# Patient Record
Sex: Male | Born: 1975 | Race: White | Hispanic: No | Smoking: Current every day smoker
Health system: Southern US, Community
[De-identification: ages and names within clinical notes are randomized; demographics above are authoritative.]

## PROBLEM LIST (undated history)

## (undated) DIAGNOSIS — N411 Chronic prostatitis: Secondary | ICD-10-CM

## (undated) DIAGNOSIS — K219 Gastro-esophageal reflux disease without esophagitis: Secondary | ICD-10-CM

## (undated) DIAGNOSIS — R911 Solitary pulmonary nodule: Secondary | ICD-10-CM

## (undated) DIAGNOSIS — T7840XA Allergy, unspecified, initial encounter: Secondary | ICD-10-CM

## (undated) DIAGNOSIS — IMO0001 Reserved for inherently not codable concepts without codable children: Secondary | ICD-10-CM

## (undated) HISTORY — DX: Chronic prostatitis: N41.1

## (undated) HISTORY — DX: Gastro-esophageal reflux disease without esophagitis: K21.9

## (undated) HISTORY — DX: Reserved for inherently not codable concepts without codable children: IMO0001

## (undated) HISTORY — DX: Solitary pulmonary nodule: R91.1

## (undated) HISTORY — DX: Allergy, unspecified, initial encounter: T78.40XA

---

## 1998-08-26 ENCOUNTER — Emergency Department (HOSPITAL_COMMUNITY): Admission: EM | Admit: 1998-08-26 | Discharge: 1998-08-26 | Payer: Self-pay | Admitting: Emergency Medicine

## 1998-08-26 ENCOUNTER — Encounter: Payer: Self-pay | Admitting: Emergency Medicine

## 1998-08-31 ENCOUNTER — Encounter: Admission: RE | Admit: 1998-08-31 | Discharge: 1998-08-31 | Payer: Self-pay | Admitting: *Deleted

## 2010-04-16 ENCOUNTER — Ambulatory Visit: Payer: Self-pay | Admitting: Sports Medicine

## 2010-11-19 ENCOUNTER — Ambulatory Visit: Payer: Self-pay

## 2011-06-06 ENCOUNTER — Ambulatory Visit: Payer: Self-pay | Admitting: Unknown Physician Specialty

## 2013-06-20 ENCOUNTER — Emergency Department: Payer: Self-pay | Admitting: Emergency Medicine

## 2013-06-21 LAB — CBC
HCT: 43.2 % (ref 40.0–52.0)
HGB: 14.9 g/dL (ref 13.0–18.0)
MCH: 30.3 pg (ref 26.0–34.0)
MCHC: 34.5 g/dL (ref 32.0–36.0)
MCV: 88 fL (ref 80–100)
Platelet: 156 10*3/uL (ref 150–440)
RBC: 4.91 10*6/uL (ref 4.40–5.90)
RDW: 13.5 % (ref 11.5–14.5)
WBC: 6.8 10*3/uL (ref 3.8–10.6)

## 2013-06-21 LAB — BASIC METABOLIC PANEL
Anion Gap: 6 — ABNORMAL LOW (ref 7–16)
BUN: 18 mg/dL (ref 7–18)
Calcium, Total: 8.6 mg/dL (ref 8.5–10.1)
Chloride: 109 mmol/L — ABNORMAL HIGH (ref 98–107)
Co2: 25 mmol/L (ref 21–32)
Creatinine: 0.82 mg/dL (ref 0.60–1.30)
EGFR (African American): >60
EGFR (Non-African Amer.): >60
Glucose: 88 mg/dL (ref 65–99)
Osmolality: 281 (ref 275–301)
Potassium: 3.8 mmol/L (ref 3.5–5.1)
Sodium: 140 mmol/L (ref 136–145)

## 2013-06-21 LAB — TROPONIN I: Troponin-I: <0.02 ng/mL

## 2014-01-30 ENCOUNTER — Ambulatory Visit: Payer: Self-pay | Admitting: Gastroenterology

## 2015-06-19 ENCOUNTER — Other Ambulatory Visit: Payer: Self-pay

## 2015-06-19 NOTE — Telephone Encounter (Signed)
Received Rx Refill request from [place]. Pt has not been seen since 05/18/2014 so this medication cannot be refilled until patient is seen in the office.  Called patient at this time to give him this information.  He states that he has actually quit this medication 3 months ago and is feeling fine without this so he does not feel that he needs to make an appointment at this time. He will call back if he develops new symptoms or if he feels that he needs to be seen.

## 2016-01-25 ENCOUNTER — Ambulatory Visit
Admission: RE | Admit: 2016-01-25 | Discharge: 2016-01-25 | Disposition: A | Discharge status: Home / Self Care | Payer: BLUE CROSS/BLUE SHIELD | Source: Ambulatory Visit | Attending: Family Medicine | Admitting: Family Medicine

## 2016-01-25 ENCOUNTER — Other Ambulatory Visit: Payer: Self-pay | Admitting: Family Medicine

## 2016-01-25 DIAGNOSIS — M79662 Pain in left lower leg: Secondary | ICD-10-CM | POA: Insufficient documentation

## 2016-12-29 ENCOUNTER — Ambulatory Visit (INDEPENDENT_AMBULATORY_CARE_PROVIDER_SITE_OTHER): Payer: BLUE CROSS/BLUE SHIELD | Admitting: Physician Assistant

## 2016-12-29 ENCOUNTER — Encounter: Payer: Self-pay | Admitting: Physician Assistant

## 2016-12-29 VITALS — BP 133/86 | HR 68 | Temp 98.2°F | Resp 16 | Ht 67.0 in | Wt 202.0 lb

## 2016-12-29 DIAGNOSIS — R911 Solitary pulmonary nodule: Secondary | ICD-10-CM | POA: Diagnosis not present

## 2016-12-29 DIAGNOSIS — IMO0001 Reserved for inherently not codable concepts without codable children: Secondary | ICD-10-CM

## 2016-12-29 DIAGNOSIS — Z87891 Personal history of nicotine dependence: Secondary | ICD-10-CM

## 2016-12-29 MED ORDER — VARENICLINE TARTRATE 1 MG PO TABS: 1.0000 mg | ORAL_TABLET | Freq: Two times a day (BID) | ORAL | 1 refills | Status: DC

## 2016-12-29 MED ORDER — VARENICLINE TARTRATE 0.5 MG X 11 & 1 MG X 42 PO MISC: ORAL | 0 refills | Status: DC

## 2016-12-29 NOTE — Patient Instructions (Signed)
     IF you received an x-ray today, you will receive an invoice from [facility]. Please contact [facility] at [phone] with questions or concerns regarding your invoice.   IF you received labwork today, you will receive an invoice from [facility]. Please contact [facility] at [phone] with questions or concerns regarding your invoice.   Our billing staff will not be able to assist you with questions regarding bills from these companies.  You will be contacted with the lab results as soon as they are available. The fastest way to get your results is to activate your My Chart account. Instructions are located on the last page of this paperwork. If you have not heard from us regarding the results in 2 weeks, please contact this office.     

## 2016-12-29 NOTE — Progress Notes (Signed)
  12/30/2016 3:00 PM   DOB: July 16, 1976 / MRN: [id number]  SUBJECTIVE:  Jordan Kelley is a 41 y.o. male presenting for follow up of lung nodule. Two years ago a 4 mm subpelural right middle lobe nodule was seen on an abdominal CT.   He would like a prescription for chantix. 25 pack year history of smoking.  Has tried several methods for quitting and was unsuccessful.  He has no allergies on file.   He  has a past medical history of Allergy; GERD (gastroesophageal reflux disease); Lung nodule < 6cm on CT; and Prostatitis, chronic.    He  reports that he has been smoking.  He has never used smokeless tobacco. He reports that he drinks about 3.0 oz of alcohol per week . He reports that he does not use drugs. He  reports that he currently engages in sexual activity and has had male partners. The patient  has no past surgical history on file.  His family history includes Diabetes in his father.  Review of Systems  Constitutional: Negative for malaise/fatigue and weight loss.  Respiratory: Negative for cough, hemoptysis and shortness of breath.   Cardiovascular: Negative for chest pain and leg swelling.    The problem list and medications were reviewed and updated by myself where necessary and exist elsewhere in the encounter.   OBJECTIVE:  BP 133/86   Pulse 68   Temp 98.2 F (36.8 C) (Oral)   Resp 16   Ht 5\' 7"  (1.702 m)   Wt 202 lb (91.6 kg)   SpO2 98%   BMI 31.64 kg/m   Physical Exam  Constitutional: He appears well-developed and well-nourished. No distress.  HENT:  Right Ear: Hearing normal.  Left Ear: Hearing normal.  Mouth/Throat: Uvula is midline, oropharynx is clear and moist and mucous membranes are normal.  Cardiovascular: Normal rate and regular rhythm.   Pulmonary/Chest: Effort normal and breath sounds normal.  Musculoskeletal: Normal range of motion.  Neurological: He is alert. He displays normal reflexes. No cranial nerve deficit. He exhibits normal muscle  tone. Coordination normal.  Skin: Skin is warm and dry. He is not diaphoretic.  Vitals reviewed.    ASSESSMENT AND PLAN:  Jordany was seen today for follow-up.  Diagnoses and all orders for this visit:  Lung nodule < 6cm on CT: RTC in three months for full physical.  Will follow below.  -     CT CHEST NODULE FOLLOW UP LOW DOSE W/O; Future  History of smoking 10-25 pack years -     varenicline (CHANTIX STARTING MONTH PAK) 0.5 MG X 11 & 1 MG X 42 tablet; Take one 0.5 mg tablet by mouth once daily for 3 days, then increase to one 0.5 mg tablet twice daily for 4 days, then increase to one 1 mg tablet twice daily. -     varenicline (CHANTIX CONTINUING MONTH PAK) 1 MG tablet; Take 1 tablet (1 mg total) by mouth 2 (two) times daily.    The patient is advised to call or return to clinic if he does not see an improvement in symptoms, or to seek the care of the closest emergency department if he worsens with the above plan.   Philis Fendt, MHS, PA-C [facility] 12/30/2016 3:00 PM

## 2017-01-14 ENCOUNTER — Ambulatory Visit
Admission: RE | Admit: 2017-01-14 | Discharge: 2017-01-14 | Disposition: A | Discharge status: Home / Self Care | Payer: BLUE CROSS/BLUE SHIELD | Source: Ambulatory Visit | Attending: Physician Assistant | Admitting: Physician Assistant

## 2017-01-14 DIAGNOSIS — R911 Solitary pulmonary nodule: Secondary | ICD-10-CM

## 2017-01-14 DIAGNOSIS — IMO0001 Reserved for inherently not codable concepts without codable children: Secondary | ICD-10-CM

## 2017-01-15 ENCOUNTER — Telehealth: Payer: Self-pay | Admitting: Physician Assistant

## 2017-01-15 MED ORDER — BUPROPION HCL ER (SR) 100 MG PO TB12: ORAL_TABLET | ORAL | 5 refills | Status: AC

## 2017-01-15 NOTE — Telephone Encounter (Signed)
Patient advised of CT resutls and recs. Will recan in 6 months from today.  Scheduling please make him an appointment in my clinic in 5 to 6 months. Philis Fendt, MS, PA-C 2:07 PM, 01/15/2017

## 2017-03-03 ENCOUNTER — Telehealth: Payer: Self-pay | Admitting: Physician Assistant

## 2017-03-03 NOTE — Telephone Encounter (Signed)
Jordan Kelley - Pt was put on wellbutrin to help him quit smoking.  He has quit, but now the medication is causing him some problems.  He wants to come off of it, but knows that it is not something he can just stop.  Please call with instructions on how to do this.  [phone]

## 2017-03-03 NOTE — Telephone Encounter (Signed)
Please advise how to wean

## 2017-03-04 ENCOUNTER — Other Ambulatory Visit: Payer: Self-pay | Admitting: Physician Assistant

## 2017-03-04 ENCOUNTER — Telehealth: Payer: Self-pay | Admitting: General Practice

## 2017-03-04 DIAGNOSIS — R918 Other nonspecific abnormal finding of lung field: Secondary | ICD-10-CM

## 2017-03-04 NOTE — Telephone Encounter (Signed)
Pt is checking on status of his message about to come off welbutrin  Best [phone]

## 2017-04-06 ENCOUNTER — Ambulatory Visit: Payer: BLUE CROSS/BLUE SHIELD | Admitting: Physician Assistant

## 2017-09-08 ENCOUNTER — Telehealth: Payer: Self-pay | Admitting: Physician Assistant

## 2017-09-08 DIAGNOSIS — R918 Other nonspecific abnormal finding of lung field: Secondary | ICD-10-CM

## 2017-09-08 NOTE — Telephone Encounter (Signed)
Pt's friend Jordan Kelley called about CT pt is needing done. She wanted to request that it be done at [facility]. I see that this was ordered in April and to be done in August but this never popped up to be scheduled and has expired. Please place new order and I will prior auth and get this scheduled. Thanks!

## 2017-09-10 NOTE — Telephone Encounter (Signed)
Order is in. Please help them navigate.  Thank you Deneise Lever. Philis Fendt, MS, PA-C 12:02 PM, 09/10/2017

## 2017-09-21 ENCOUNTER — Ambulatory Visit: Payer: BLUE CROSS/BLUE SHIELD | Admitting: Physician Assistant

## 2017-09-23 ENCOUNTER — Ambulatory Visit
Admission: RE | Admit: 2017-09-23 | Discharge: 2017-09-23 | Disposition: A | Discharge status: Home / Self Care | Payer: BLUE CROSS/BLUE SHIELD | Source: Ambulatory Visit | Attending: Physician Assistant | Admitting: Physician Assistant

## 2017-09-23 ENCOUNTER — Telehealth: Payer: Self-pay

## 2017-09-23 DIAGNOSIS — R918 Other nonspecific abnormal finding of lung field: Secondary | ICD-10-CM | POA: Diagnosis present

## 2017-09-23 NOTE — Progress Notes (Signed)
Call patient and advise that there are no new nodules and the remaining nodules are not growing.  Repeat scan in one year.  Please send a letter. Please order CT chest Wo contrast for one year from today.  I will cosign. Philis Fendt, MS, PA-C 3:46 PM, 09/23/2017

## 2017-09-23 NOTE — Telephone Encounter (Signed)
Call patient and advise that there are no new nodules and the remaining nodules are not growing. Repeat scan in one year. Please send a letter. Please order CT chest Wo contrast for one year from today. I will cosign. Philis Fendt, MS, PA-C 3:46 PM, 09/23/2017

## 2017-09-29 ENCOUNTER — Encounter: Payer: Self-pay | Admitting: Physician Assistant

## 2017-09-29 ENCOUNTER — Ambulatory Visit (INDEPENDENT_AMBULATORY_CARE_PROVIDER_SITE_OTHER): Payer: BLUE CROSS/BLUE SHIELD | Admitting: Physician Assistant

## 2017-09-29 VITALS — BP 140/92 | HR 60 | Temp 98.2°F | Resp 16 | Ht 67.0 in | Wt 197.0 lb

## 2017-09-29 DIAGNOSIS — F17200 Nicotine dependence, unspecified, uncomplicated: Secondary | ICD-10-CM

## 2017-09-29 DIAGNOSIS — D229 Melanocytic nevi, unspecified: Secondary | ICD-10-CM | POA: Diagnosis not present

## 2017-09-29 MED ORDER — NICOTINE 10 MG IN INHA: 1.0000 | RESPIRATORY_TRACT | 0 refills | Status: AC | PRN

## 2017-09-29 MED ORDER — MUPIROCIN 2 % EX OINT: 1.0000 "application " | TOPICAL_OINTMENT | Freq: Two times a day (BID) | CUTANEOUS | 0 refills | Status: AC

## 2017-09-29 NOTE — Progress Notes (Signed)
    10/29/2017 2:09 PM   DOB: October 06, 1976 / MRN: [id number]  SUBJECTIVE:  Jordan Kelley is a 41 y.o. male presenting for discussion of CT scan for multiple lung nodules.  Unfortunately he continues to smoke but is willing to try anything to stop.    Complains of a lesion on he leg that has been there for a long time.  Feels that it is growing and is worried it may be something ominous.  Would like this taken off today if time allows.   He has no allergies on file.   He  has a past medical history of Allergy, GERD (gastroesophageal reflux disease), Lung nodule < 6cm on CT, and Prostatitis, chronic.    He  reports that he has been smoking.  he has never used smokeless tobacco. He reports that he drinks about 3.0 oz of alcohol per week. He reports that he does not use drugs. He  reports that he currently engages in sexual activity and has had partners who are Male. The patient  has no past surgical history on file.  His family history includes Diabetes in his father.  Review of Systems  Constitutional: Negative for chills and fever.  Gastrointestinal: Negative for nausea.  Skin: Negative for itching and rash.  Neurological: Negative for dizziness.    The problem list and medications were reviewed and updated by myself where necessary and exist elsewhere in the encounter.   OBJECTIVE:  BP (!) 140/92 (BP Location: Left Arm, Patient Position: Sitting, Cuff Size: Normal)   Pulse 60   Temp 98.2 F (36.8 C) (Oral)   Resp 16   Ht 5\' 7"  (1.702 m)   Wt 197 lb (89.4 kg)   SpO2 99%   BMI 30.85 kg/m   Physical Exam  Constitutional: He appears well-developed. He is active and cooperative.  Non-toxic appearance.  Cardiovascular: Normal rate.  Pulmonary/Chest: Effort normal. No tachypnea.  Neurological: He is alert.  Skin: Skin is warm and dry. He is not diaphoretic. No pallor.     Vitals reviewed.   No results found for this or any previous visit (from the past 72  hour(s)).  No results found.  Risk and benefits discussed and verbal consent obtained. Anesthetic allergies reviewed. Patient anesthetized using 1:1 mix of 2% lidocaine with epi. Lesion excised in an elliptical fashion and wound repaired with dissolvable suture material. The patient tolerated the procedure without difficulty.   A clean dressing was placed and wound care instructions were provided.    ASSESSMENT AND PLAN:  Zacarias was seen today for follow-up.  Diagnoses and all orders for this visit:  Suspicious nevus -     mupirocin ointment (BACTROBAN) 2 %; Apply 1 application topically 2 (two) times daily. -     Dermatology pathology  Smoker: Results discussed with patient.  Will plan to repeat CT scan in one year.  Will see him back in the office before that time.   -     nicotine (NICOTROL) 10 MG inhaler; Inhale 1 Cartridge (1 continuous puffing total) into the lungs as needed for smoking cessation.    The patient is advised to call or return to clinic if he does not see an improvement in symptoms, or to seek the care of the closest emergency department if he worsens with the above plan.   Philis Fendt, MHS, PA-C [facility] 10/29/2017 2:09 PM

## 2017-09-29 NOTE — Patient Instructions (Addendum)
WOUND CARE Please return in as needed for any problems with the wound. Marland Kitchen Keep area clean and dry for 24 hours. Do not remove bandage, if applied. . After 24 hours, remove bandage and wash wound gently with mild soap and warm water. Reapply a new bandage after cleaning wound, if directed. . Continue daily cleansing with soap and water until stitches/staples are removed. . Do not apply any ointments or creams to the wound while stitches/staples are in place, as this may cause delayed healing. . Notify the office if you experience any of the following signs of infection: Swelling, redness, pus drainage, streaking, fever >101.0 F . Notify the office if you experience excessive bleeding that does not stop after 15-20 minutes of constant, firm pressure.  CLINICAL DATA:  Persistent cough, follow-up lung nodules  EXAM: CT CHEST WITHOUT CONTRAST  TECHNIQUE: Multidetector CT imaging of the chest was performed following the standard protocol without IV contrast.  COMPARISON:  01/14/2017  FINDINGS: Cardiovascular: Somewhat limited due to the lack of IV contrast. No aneurysmal dilatation is noted. No significant calcific changes are seen. No cardiac enlargement is noted.  Mediastinum/Nodes: The thoracic inlet is within normal limits. No significant hilar or mediastinal adenopathy is noted. Stable nodes are noted in the axilla bilaterally but not significant by size criteria. The esophagus is within normal limits.  Lungs/Pleura: The lungs are again well aerated without focal infiltrate or sizable effusion. Stable scattered nodular changes are noted in the right middle lobe consistent with a benign etiology given their long-term stability. 6 mm nodule is again noted in the left costophrenic angle laterally best seen on image number 107 of series 3. This is stable in appearance from the prior study. No new focal abnormality is seen.  Upper Abdomen: No acute  abnormality.  Musculoskeletal: No chest wall mass or suspicious bone lesions identified.  IMPRESSION: Stable nodules bilaterally. The dominant nodule on the left lower lobe is stable from the prior exam. Given the stability, future CT at 10-16 months (from today's scan) is considered optional for low-risk patients, but is recommended for high-risk patients. This recommendation follows the consensus statement: Guidelines for Management of Incidental Pulmonary Nodules Detected on CT Images: From the Fleischner Society 2017; Radiology 2017; 284:228-243.   Electronically Signed   By: Inez Catalina M.D.   On: 09/23/2017 14:42     IF you received an x-ray today, you will receive an invoice from [facility]. Please contact [facility] at [phone] with questions or concerns regarding your invoice.   IF you received labwork today, you will receive an invoice from [facility]. Please contact [facility] at [phone] with questions or concerns regarding your invoice.   Our billing staff will not be able to assist you with questions regarding bills from these companies.  You will be contacted with the lab results as soon as they are available. The fastest way to get your results is to activate your My Chart account. Instructions are located on the last page of this paperwork. If you have not heard from [place] regarding the results in 2 weeks, please contact this office.

## 2018-07-16 ENCOUNTER — Ambulatory Visit: Payer: BLUE CROSS/BLUE SHIELD | Admitting: Physician Assistant

## 2018-08-04 ENCOUNTER — Encounter: Payer: BLUE CROSS/BLUE SHIELD | Admitting: Urgent Care

## 2018-11-08 ENCOUNTER — Other Ambulatory Visit: Payer: Self-pay | Admitting: Family Medicine

## 2018-11-08 DIAGNOSIS — R918 Other nonspecific abnormal finding of lung field: Secondary | ICD-10-CM

## 2018-11-12 ENCOUNTER — Ambulatory Visit
Admission: RE | Admit: 2018-11-12 | Discharge: 2018-11-12 | Disposition: A | Discharge status: Home / Self Care | Payer: BLUE CROSS/BLUE SHIELD | Source: Ambulatory Visit | Attending: Family Medicine | Admitting: Family Medicine

## 2018-11-12 DIAGNOSIS — R918 Other nonspecific abnormal finding of lung field: Secondary | ICD-10-CM | POA: Insufficient documentation

## 2019-01-21 IMAGING — CT CT CHEST W/O CM
2 of 4 series · 15 of 36 positions shown, 18 images · non-contrast
Comparison: 01/14/2017 and 09/23/2017

CLINICAL DATA: Follow-up pulmonary nodules.

EXAM:
CT CHEST WITHOUT CONTRAST
TECHNIQUE: Multidetector CT imaging of the chest was performed following the
standard protocol without IV contrast.

[Series 2: chest · axial · 0.71mm/px · z∈[-1408,-1130]mm · 12 of 165 slices shown, 15 images (1 of 2)]
[im 13/165  mediastinal]
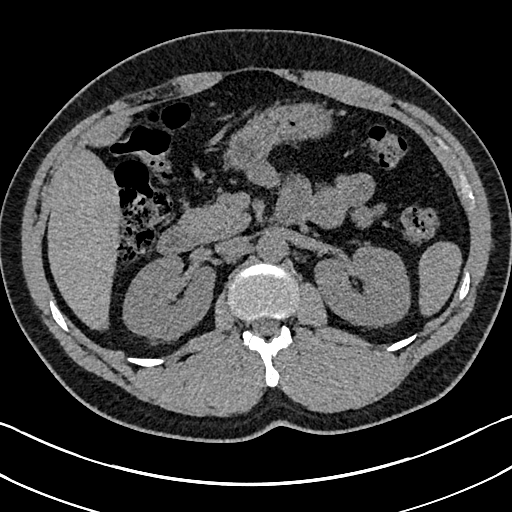
[im 13/165  lung]
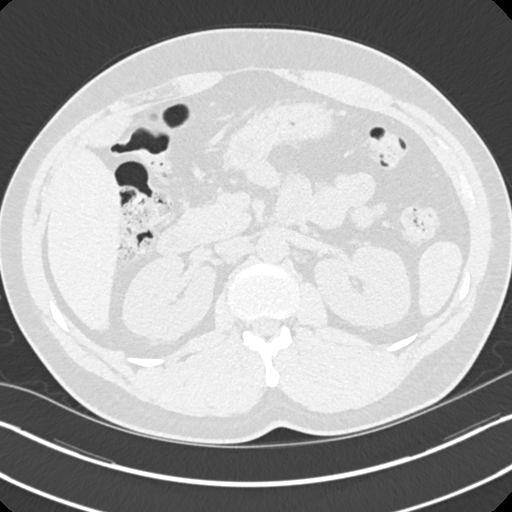
[im 26/165  lung]
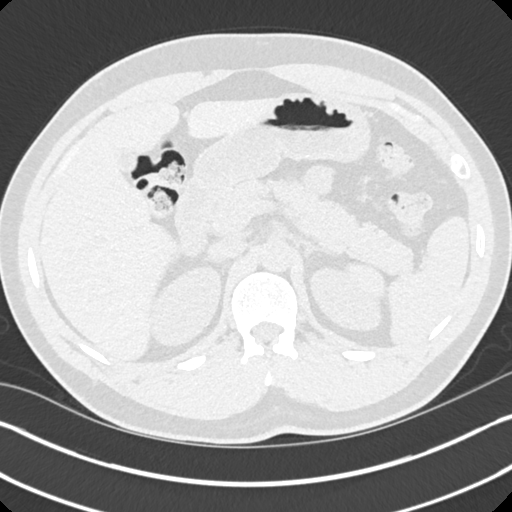
[im 38/165  lung]
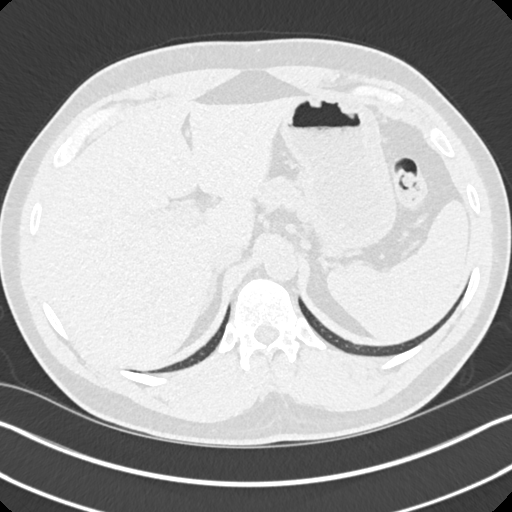
[im 51/165  lung]
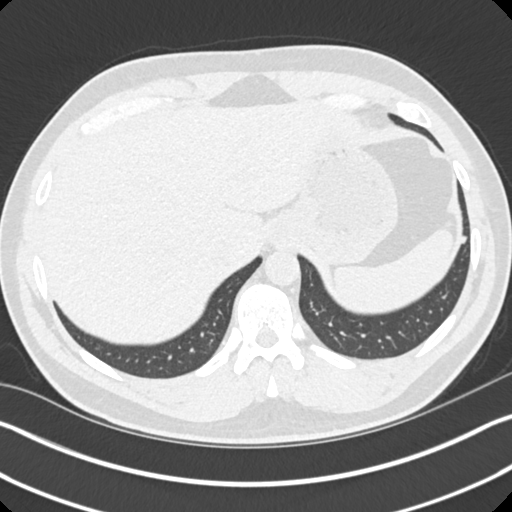
[im 64/165  mediastinal]
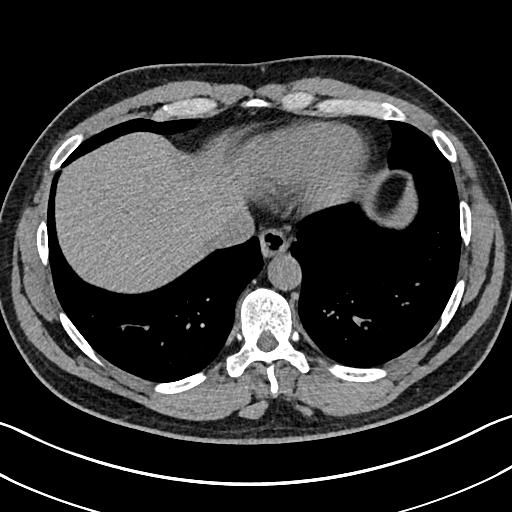
[im 64/165  lung]
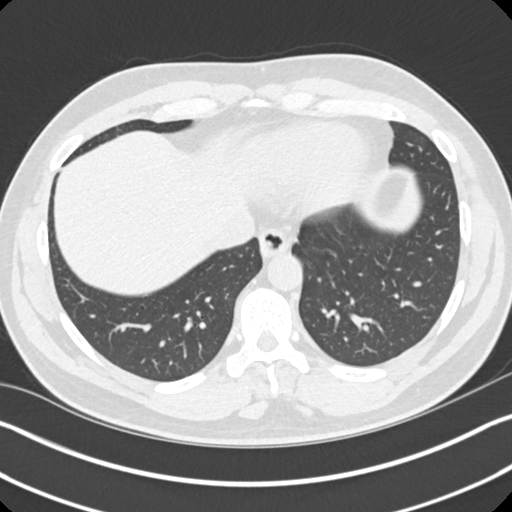
[im 76/165  lung]
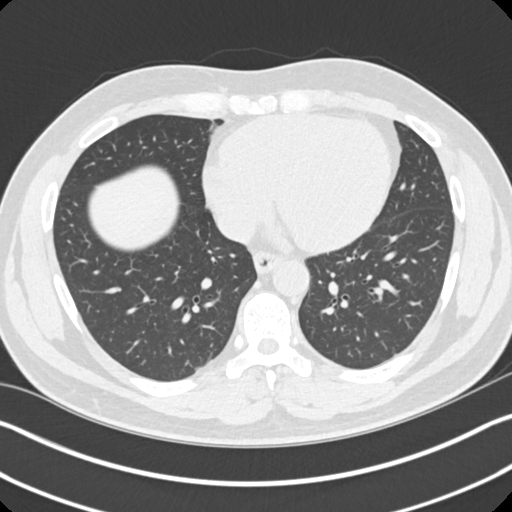
[im 89/165  lung]
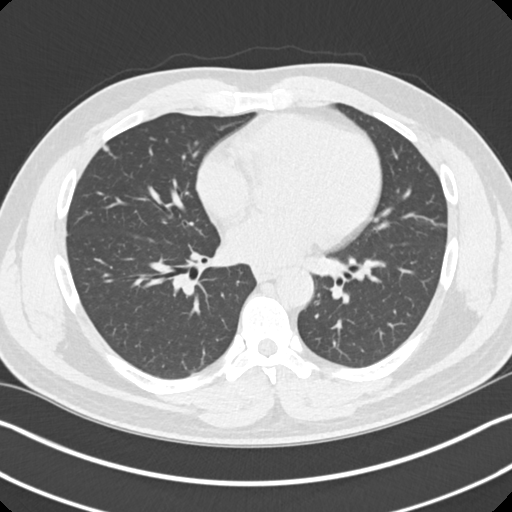
[im 101/165  lung]
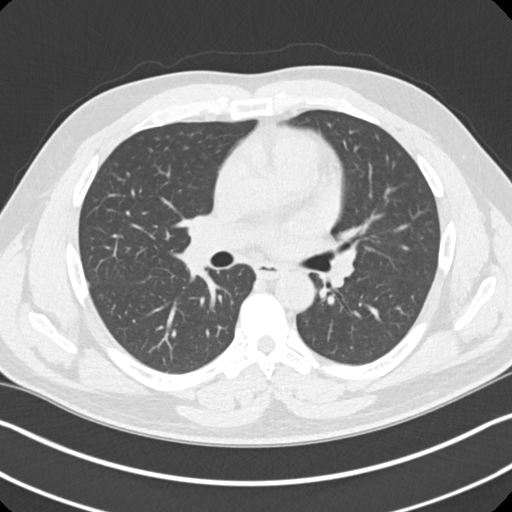
[im 114/165  mediastinal]
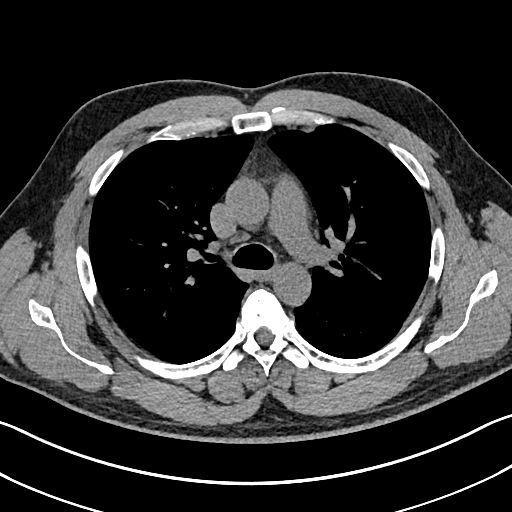
[im 114/165  lung]
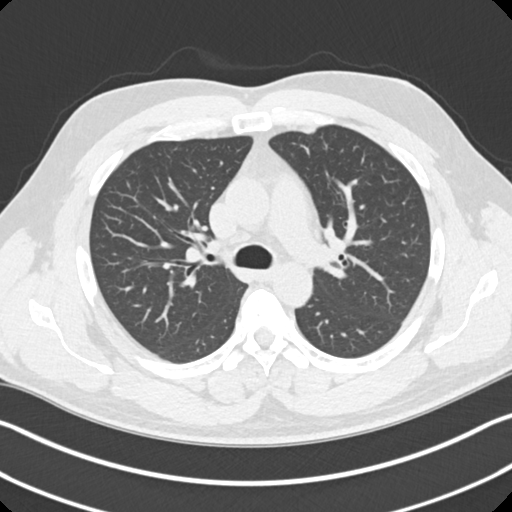
[im 127/165  lung]
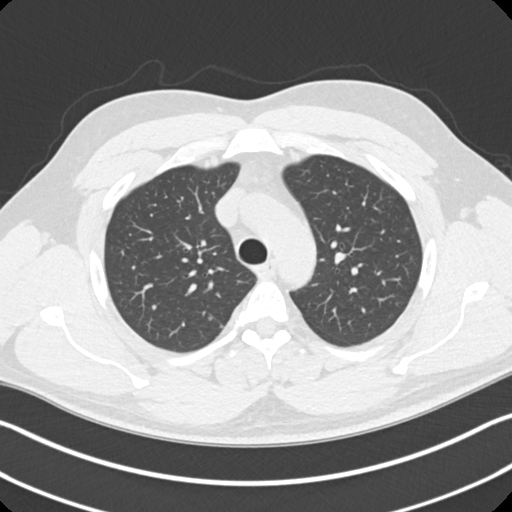
[im 139/165  lung]
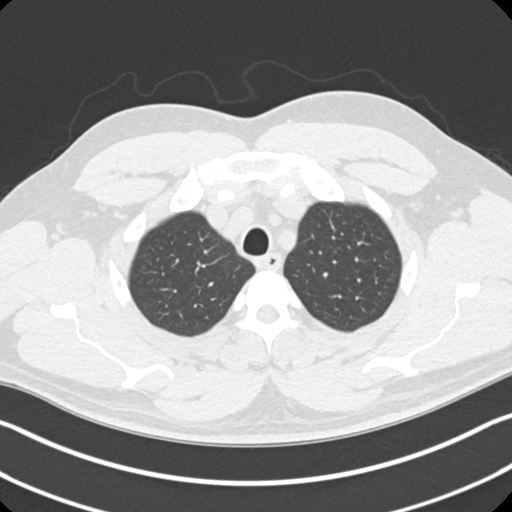
[im 152/165  lung]
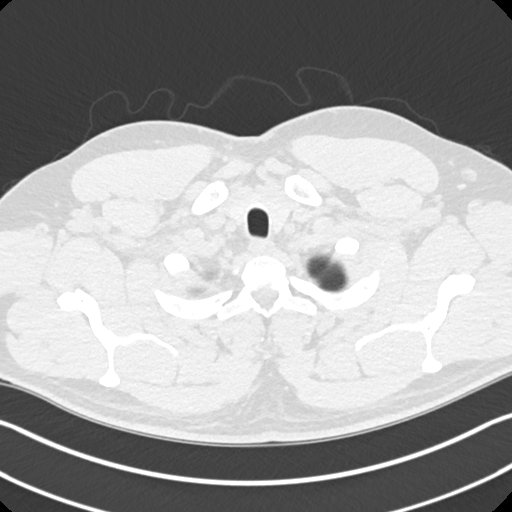

[Series 5: chest · coronal · 0.64mm/px · 3 of 137 slices shown (2 of 2)]
[im 28/137  lung]
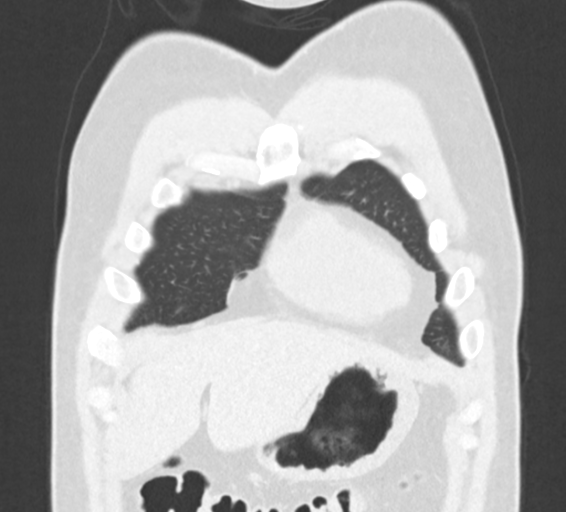
[im 55/137  lung]
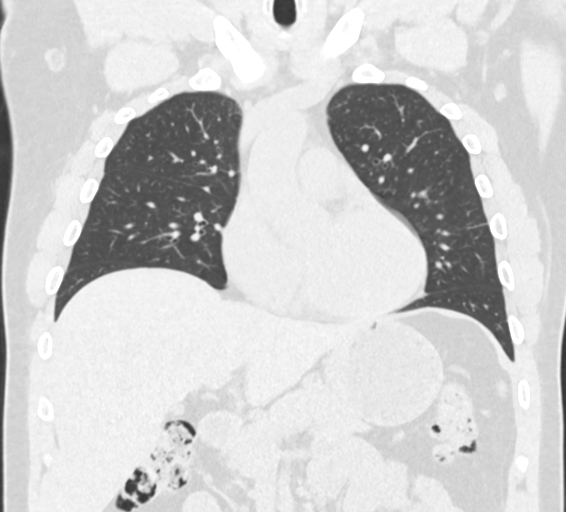
[im 82/137  lung]
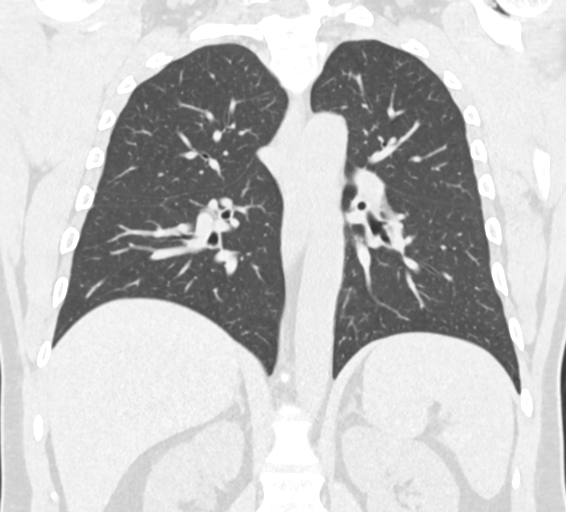

[15 of 36 positions shown; findings below may reference images not displayed]

FINDINGS: Cardiovascular: Heart is normal size. Vascular structures are within
normal.

Mediastinum/Nodes: No mediastinal or hilar adenopathy.

Lungs/Pleura: Lungs are adequately inflated without focal airspace
consolidation or effusion. Stable 4 mm nodule over the right middle
lobe. A few tiny 1-2 mm subpleural nodules over the right middle
lobe unchanged. 5 mm nodule over the lateral left lower lobe
unchanged. No new nodules identified. Airways are normal.

Upper Abdomen: No acute findings. Possible small midline ventral
hernia partially visualized on the most inferior image.

Musculoskeletal: Mild degenerative change of the spine.
IMPRESSION: A few small bilateral pulmonary nodules with the largest measuring 5
mm over the lateral left lower lobe. Findings are stable since
Sunday January, 2017 and can be considered benign. No further workup
needed.

Possible partially visualized small ventral hernia over the midline
upper abdomen.

## 2024-08-29 DIAGNOSIS — K4090 Unilateral inguinal hernia, without obstruction or gangrene, not specified as recurrent: Secondary | ICD-10-CM | POA: Diagnosis not present
